# Patient Record
Sex: Male | Born: 1990
Health system: Southern US, Community
[De-identification: ages and names within clinical notes are randomized; demographics above are authoritative.]

## PROBLEM LIST (undated history)

## (undated) DIAGNOSIS — S83511A Sprain of anterior cruciate ligament of right knee, initial encounter: Secondary | ICD-10-CM

## (undated) DIAGNOSIS — S83206A Unspecified tear of unspecified meniscus, current injury, right knee, initial encounter: Secondary | ICD-10-CM

## (undated) HISTORY — PX: SKIN CANCER EXCISION: SHX779

## (undated) HISTORY — PX: NO PAST SURGERIES: SHX2092

---
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
## 2015-06-30 ENCOUNTER — Other Ambulatory Visit: Payer: Self-pay | Admitting: Urology

## 2017-05-02 ENCOUNTER — Encounter (HOSPITAL_BASED_OUTPATIENT_CLINIC_OR_DEPARTMENT_OTHER): Payer: Self-pay | Admitting: *Deleted

## 2017-05-02 NOTE — Progress Notes (Signed)
NPO AFTER MN W/ EXCEPTION CLEAR LIQUIDS UNTIL 0630 (NO CREAM [place] [employer]).  PT VERBALIZED UNDERSTANDING THIS INCLUDED NO DIP TOBACCO AFTER 0630 DOS.  ARRIVE AT 1100.  NEEDS HG.

## 2017-05-05 ENCOUNTER — Encounter (HOSPITAL_BASED_OUTPATIENT_CLINIC_OR_DEPARTMENT_OTHER)
Admission: RE | Disposition: A | Discharge status: Home / Self Care | Payer: Self-pay | Source: Ambulatory Visit | Attending: Orthopedic Surgery

## 2017-05-05 ENCOUNTER — Ambulatory Visit (HOSPITAL_BASED_OUTPATIENT_CLINIC_OR_DEPARTMENT_OTHER): Payer: Self-pay | Admitting: Anesthesiology

## 2017-05-05 ENCOUNTER — Encounter (HOSPITAL_BASED_OUTPATIENT_CLINIC_OR_DEPARTMENT_OTHER): Payer: Self-pay

## 2017-05-05 ENCOUNTER — Ambulatory Visit (HOSPITAL_BASED_OUTPATIENT_CLINIC_OR_DEPARTMENT_OTHER)
Admission: RE | Admit: 2017-05-05 | Discharge: 2017-05-05 | Disposition: A | Discharge status: Home / Self Care | Payer: Self-pay | Source: Ambulatory Visit | Attending: Orthopedic Surgery | Admitting: Orthopedic Surgery

## 2017-05-05 DIAGNOSIS — S83511A Sprain of anterior cruciate ligament of right knee, initial encounter: Secondary | ICD-10-CM | POA: Insufficient documentation

## 2017-05-05 DIAGNOSIS — M659 Synovitis and tenosynovitis, unspecified: Secondary | ICD-10-CM | POA: Insufficient documentation

## 2017-05-05 DIAGNOSIS — S83281A Other tear of lateral meniscus, current injury, right knee, initial encounter: Secondary | ICD-10-CM | POA: Insufficient documentation

## 2017-05-05 HISTORY — PX: KNEE ARTHROSCOPY WITH ANTERIOR CRUCIATE LIGAMENT (ACL) REPAIR: SHX5644

## 2017-05-05 HISTORY — DX: Sprain of anterior cruciate ligament of right knee, initial encounter: S83.511A

## 2017-05-05 HISTORY — DX: Unspecified tear of unspecified meniscus, current injury, right knee, initial encounter: S83.206A

## 2017-05-05 LAB — POCT HEMOGLOBIN-HEMACUE: HEMOGLOBIN: 15.7 g/dL (ref 13.0–17.0)

## 2017-05-05 SURGERY — KNEE ARTHROSCOPY WITH ANTERIOR CRUCIATE LIGAMENT (ACL) REPAIR
Anesthesia: General | Site: Knee | Laterality: Right

## 2017-05-05 MED ORDER — FENTANYL CITRATE (PF) 100 MCG/2ML IJ SOLN
INTRAMUSCULAR | Status: DC | PRN
  Administered 2017-05-05 (×4): 50 ug via INTRAVENOUS
  Administered 2017-05-05 (×2): 25 ug via INTRAVENOUS
  Administered 2017-05-05: 50 ug via INTRAVENOUS

## 2017-05-05 MED ORDER — ONDANSETRON HCL 4 MG/2ML IJ SOLN
INTRAMUSCULAR | Status: DC | PRN
  Administered 2017-05-05: 4 mg via INTRAVENOUS

## 2017-05-05 MED ORDER — HYDROMORPHONE HCL 1 MG/ML IJ SOLN
0.2500 mg | INTRAMUSCULAR | Status: DC | PRN
  Administered 2017-05-05: 0.25 mg via INTRAVENOUS
  Administered 2017-05-05: 0.5 mg via INTRAVENOUS
  Administered 2017-05-05: 0.25 mg via INTRAVENOUS
  Filled 2017-05-05: qty 0.5

## 2017-05-05 MED ORDER — LIDOCAINE 2% (20 MG/ML) 5 ML SYRINGE
INTRAMUSCULAR | Status: AC
  Filled 2017-05-05: qty 5

## 2017-05-05 MED ORDER — ACETAMINOPHEN 500 MG PO TABS
ORAL_TABLET | ORAL | Status: AC
  Filled 2017-05-05: qty 2

## 2017-05-05 MED ORDER — MIDAZOLAM HCL 2 MG/2ML IJ SOLN
INTRAMUSCULAR | Status: AC
  Filled 2017-05-05: qty 2

## 2017-05-05 MED ORDER — GABAPENTIN 300 MG PO CAPS
ORAL_CAPSULE | ORAL | Status: AC
  Filled 2017-05-05: qty 1

## 2017-05-05 MED ORDER — ASPIRIN EC 325 MG PO TBEC: 325.0000 mg | DELAYED_RELEASE_TABLET | Freq: Every day | ORAL | 3 refills | Status: AC

## 2017-05-05 MED ORDER — PROPOFOL 10 MG/ML IV BOLUS
INTRAVENOUS | Status: AC
  Filled 2017-05-05: qty 20

## 2017-05-05 MED ORDER — ROPIVACAINE HCL 7.5 MG/ML IJ SOLN
INTRAMUSCULAR | Status: DC | PRN
  Administered 2017-05-05: 20 mL via PERINEURAL

## 2017-05-05 MED ORDER — ACETAMINOPHEN 500 MG PO TABS
1000.0000 mg | ORAL_TABLET | Freq: Once | ORAL | Status: AC
  Administered 2017-05-05: 1000 mg via ORAL
  Filled 2017-05-05: qty 2

## 2017-05-05 MED ORDER — OXYCODONE HCL 5 MG PO TABS
5.0000 mg | ORAL_TABLET | Freq: Once | ORAL | Status: AC
  Administered 2017-05-05: 5 mg via ORAL
  Filled 2017-05-05: qty 1

## 2017-05-05 MED ORDER — GABAPENTIN 300 MG PO CAPS
300.0000 mg | ORAL_CAPSULE | Freq: Once | ORAL | Status: AC
  Administered 2017-05-05: 300 mg via ORAL
  Filled 2017-05-05: qty 1

## 2017-05-05 MED ORDER — LACTATED RINGERS IV SOLN
INTRAVENOUS | Status: DC
  Administered 2017-05-05: 14:00:00 via INTRAVENOUS
  Administered 2017-05-05: 1000 mL via INTRAVENOUS
  Filled 2017-05-05: qty 1000

## 2017-05-05 MED ORDER — PROPOFOL 10 MG/ML IV BOLUS
INTRAVENOUS | Status: DC | PRN
  Administered 2017-05-05: 200 mg via INTRAVENOUS

## 2017-05-05 MED ORDER — DEXAMETHASONE SODIUM PHOSPHATE 10 MG/ML IJ SOLN
INTRAMUSCULAR | Status: DC | PRN
  Administered 2017-05-05: 10 mg via INTRAVENOUS

## 2017-05-05 MED ORDER — OXYCODONE HCL 5 MG PO TABS: 5.0000 mg | ORAL_TABLET | Freq: Four times a day (QID) | ORAL | 0 refills | Status: AC | PRN

## 2017-05-05 MED ORDER — CEFAZOLIN SODIUM-DEXTROSE 2-4 GM/100ML-% IV SOLN
2.0000 g | Freq: Once | INTRAVENOUS | Status: AC
Start: 2017-05-05 — End: 2017-05-05
  Administered 2017-05-05: 2 g via INTRAVENOUS
  Filled 2017-05-05: qty 100

## 2017-05-05 MED ORDER — CELECOXIB 200 MG PO CAPS
ORAL_CAPSULE | ORAL | Status: AC
  Filled 2017-05-05: qty 2

## 2017-05-05 MED ORDER — FENTANYL CITRATE (PF) 100 MCG/2ML IJ SOLN
INTRAMUSCULAR | Status: AC
  Filled 2017-05-05: qty 2

## 2017-05-05 MED ORDER — HYDROMORPHONE HCL-NACL 0.5-0.9 MG/ML-% IV SOSY
PREFILLED_SYRINGE | INTRAVENOUS | Status: AC
  Filled 2017-05-05: qty 1

## 2017-05-05 MED ORDER — WHITE PETROLATUM GEL
Status: AC
  Filled 2017-05-05: qty 5

## 2017-05-05 MED ORDER — CHLORHEXIDINE GLUCONATE 4 % EX LIQD
60.0000 mL | Freq: Once | CUTANEOUS | Status: DC
  Filled 2017-05-05: qty 118

## 2017-05-05 MED ORDER — CELECOXIB 400 MG PO CAPS
400.0000 mg | ORAL_CAPSULE | Freq: Once | ORAL | Status: AC
  Administered 2017-05-05: 400 mg via ORAL
  Filled 2017-05-05: qty 1

## 2017-05-05 MED ORDER — CEFAZOLIN SODIUM-DEXTROSE 2-4 GM/100ML-% IV SOLN
INTRAVENOUS | Status: AC
  Filled 2017-05-05: qty 100

## 2017-05-05 MED ORDER — PROMETHAZINE HCL 25 MG/ML IJ SOLN
6.2500 mg | INTRAMUSCULAR | Status: DC | PRN
  Filled 2017-05-05: qty 1

## 2017-05-05 MED ORDER — BUPIVACAINE HCL (PF) 0.25 % IJ SOLN
INTRAMUSCULAR | Status: DC | PRN
  Administered 2017-05-05: 20 mL

## 2017-05-05 MED ORDER — OXYCODONE HCL 5 MG PO TABS
ORAL_TABLET | ORAL | Status: AC
  Filled 2017-05-05: qty 1

## 2017-05-05 MED ORDER — MIDAZOLAM HCL 2 MG/2ML IJ SOLN
INTRAMUSCULAR | Status: DC | PRN
  Administered 2017-05-05: 2 mg via INTRAVENOUS

## 2017-05-05 MED ORDER — GLYCOPYRROLATE 0.2 MG/ML IV SOSY
PREFILLED_SYRINGE | INTRAVENOUS | Status: DC | PRN
  Administered 2017-05-05: .2 mg via INTRAVENOUS

## 2017-05-05 MED ORDER — CEFAZOLIN SODIUM-DEXTROSE 2-4 GM/100ML-% IV SOLN
2.0000 g | INTRAVENOUS | Status: AC
  Administered 2017-05-05: 2 g via INTRAVENOUS
  Filled 2017-05-05: qty 100

## 2017-05-05 MED ORDER — LIDOCAINE 2% (20 MG/ML) 5 ML SYRINGE
INTRAMUSCULAR | Status: DC | PRN
  Administered 2017-05-05: 80 mg via INTRAVENOUS

## 2017-05-05 MED ORDER — GLYCOPYRROLATE 0.2 MG/ML IV SOSY
PREFILLED_SYRINGE | INTRAVENOUS | Status: AC
  Filled 2017-05-05: qty 5

## 2017-05-05 MED FILL — oxyCODONE HCL 5 MG TABS: 5 | 4 days supply | Qty: 30 | Fill #0

## 2017-05-05 SURGICAL SUPPLY — 91 items
ANCH SUT SWLK 19.1X4.75 (Anchor) ×1 IMPLANT
ANCHOR BUTTON TIGHTROPE ABS (Orthopedic Implant) ×2 IMPLANT
ANCHOR SUT BIO SW 4.75X19.1 (Anchor) ×2 IMPLANT
BANDAGE ELASTIC 6 VELCRO ST LF (GAUZE/BANDAGES/DRESSINGS) ×3 IMPLANT
BANDAGE ESMARK 6X9 LF (GAUZE/BANDAGES/DRESSINGS) ×1 IMPLANT
BLADE 4.2CUDA (BLADE) IMPLANT
BLADE CUDA 5.5 (BLADE) IMPLANT
BLADE CUDA GRT WHITE 3.5 (BLADE) IMPLANT
BLADE GREAT WHITE 4.2 (BLADE) IMPLANT
BLADE GREAT WHITE 4.2MM (BLADE)
BLADE GREAT WHITE SHAVER 5.5 (BLADE) IMPLANT
BLADE GREAT WHITE SHAVER 5.5MM (BLADE)
BLADE SURG 10 STRL SS (BLADE) ×3 IMPLANT
BLADE SURG 11 STRL SS (BLADE) ×4 IMPLANT
BLADE SURG 15 STRL LF DISP TIS (BLADE) ×1 IMPLANT
BLADE SURG 15 STRL SS (BLADE) ×6
BNDG CMPR 9X6 STRL LF SNTH (GAUZE/BANDAGES/DRESSINGS) ×1
BNDG ESMARK 6X9 LF (GAUZE/BANDAGES/DRESSINGS) ×3
BNDG GAUZE ELAST 4 BULKY (GAUZE/BANDAGES/DRESSINGS) ×3 IMPLANT
BUR OVAL 6.0 (BURR) IMPLANT
BUR VERTEX HOODED 4.5 (BURR) IMPLANT
CANISTER SUCTION 1200CC (MISCELLANEOUS) ×3 IMPLANT
CLOSURE WOUND 1/2 X4 (GAUZE/BANDAGES/DRESSINGS) ×2
COVER BACK TABLE 60X90IN (DRAPES) ×3 IMPLANT
CUFF TOURNIQUET SINGLE 34IN LL (TOURNIQUET CUFF) ×3 IMPLANT
CUTTER FLIP 10MM (CUTTER) ×2 IMPLANT
CUTTER FLIP 9.5MM (CUTTER) ×2 IMPLANT
DRAPE ARTHROSCOPY W/POUCH 114 (DRAPES) ×3 IMPLANT
DRAPE C-ARM 42X72 X-RAY (DRAPES) ×1 IMPLANT
DRAPE INCISE IOBAN 66X45 STRL (DRAPES) ×2 IMPLANT
DRAPE LG THREE QUARTER DISP (DRAPES) ×3 IMPLANT
DRAPE U-SHAPE 47X51 STRL (DRAPES) ×3 IMPLANT
DURAPREP 26ML APPLICATOR (WOUND CARE) ×3 IMPLANT
ELECT REM PT RETURN 9FT ADLT (ELECTROSURGICAL) ×3
ELECTRODE REM PT RTRN 9FT ADLT (ELECTROSURGICAL) ×1 IMPLANT
FIBERSTICK 2 (SUTURE) ×1 IMPLANT
GAUZE SPONGE 4X4 12PLY STRL (GAUZE/BANDAGES/DRESSINGS) ×2 IMPLANT
GAUZE SPONGE 4X4 12PLY STRL LF (GAUZE/BANDAGES/DRESSINGS) ×2 IMPLANT
GAUZE XEROFORM 1X8 LF (GAUZE/BANDAGES/DRESSINGS) ×3 IMPLANT
GLOVE BIO SURGEON STRL SZ7.5 (GLOVE) ×11 IMPLANT
GLOVE INDICATOR 8.0 STRL GRN (GLOVE) ×5 IMPLANT
GOWN STRL REUS W/ TWL LRG LVL3 (GOWN DISPOSABLE) ×1 IMPLANT
GOWN STRL REUS W/ TWL XL LVL3 (GOWN DISPOSABLE) ×2 IMPLANT
GOWN STRL REUS W/TWL LRG LVL3 (GOWN DISPOSABLE) ×3
GOWN STRL REUS W/TWL XL LVL3 (GOWN DISPOSABLE) ×6
IMMOBILIZER KNEE 22 UNIV (SOFTGOODS) IMPLANT
IV NS IRRIG 3000ML ARTHROMATIC (IV SOLUTION) ×8 IMPLANT
KIT RM TURNOVER CYSTO AR (KITS) ×3 IMPLANT
KNEE WRAP E Z 3 GEL PACK (MISCELLANEOUS) ×3 IMPLANT
LABRAL TAPE ×2 IMPLANT
MANIFOLD NEPTUNE II (INSTRUMENTS) ×3 IMPLANT
NEEDLE ELECTRODE (NEEDLE) IMPLANT
NEEDLE HYPO 22GX1.5 SAFETY (NEEDLE) ×3 IMPLANT
PACK ARTHROSCOPY DSU (CUSTOM PROCEDURE TRAY) ×3 IMPLANT
PACK BASIN DAY SURGERY FS (CUSTOM PROCEDURE TRAY) ×3 IMPLANT
PAD ABD 8X10 STRL (GAUZE/BANDAGES/DRESSINGS) ×4 IMPLANT
PAD ARMBOARD 7.5X6 YLW CONV (MISCELLANEOUS) IMPLANT
PENCIL BUTTON HOLSTER BLD 10FT (ELECTRODE) ×2 IMPLANT
PK GRAFTLINK AUTO IMPLANT SYST (Anchor) ×3 IMPLANT
PROBE BIPOLAR ATHRO 135MM 90D (MISCELLANEOUS) IMPLANT
SET ARTHROSCOPY TUBING (MISCELLANEOUS) ×3
SET ARTHROSCOPY TUBING LN (MISCELLANEOUS) ×1 IMPLANT
SET PAD KNEE POSITIONER (MISCELLANEOUS) IMPLANT
SHAVER 4.2 MM LANZA 9391A (BLADE) ×2 IMPLANT
SPONGE GAUZE 4X4 12PLY STER LF (GAUZE/BANDAGES/DRESSINGS) ×6 IMPLANT
SPONGE LAP 4X18 X RAY DECT (DISPOSABLE) ×3 IMPLANT
STRIP CLOSURE SKIN 1/2X4 (GAUZE/BANDAGES/DRESSINGS) ×3 IMPLANT
SUCTION FRAZIER HANDLE 10FR (MISCELLANEOUS) ×2
SUCTION TUBE FRAZIER 10FR DISP (MISCELLANEOUS) ×1 IMPLANT
SUT 2 FIBERLOOP 20 STRT BLUE (SUTURE)
SUT FIBERWIRE #2 38 REV NDL BL (SUTURE)
SUT FIBERWIRE #2 38 T-5 BLUE (SUTURE)
SUT MNCRL AB 3-0 PS2 18 (SUTURE) ×5 IMPLANT
SUT VIC AB 0 CT2 27 (SUTURE) ×6 IMPLANT
SUT VIC AB 2-0 CT2 27 (SUTURE) ×3 IMPLANT
SUT VIC AB 2-0 SH 27 (SUTURE) ×6
SUT VIC AB 2-0 SH 27XBRD (SUTURE) ×2 IMPLANT
SUTURE 2 FIBERLOOP 20 STRT BLU (SUTURE) ×1 IMPLANT
SUTURE FIBERWR #2 38 T-5 BLUE (SUTURE) IMPLANT
SUTURE FIBERWR#2 38 REV NDL BL (SUTURE) IMPLANT
SUTURE TIGERSTICK 2 TIGERWIR 2 (MISCELLANEOUS) ×1 IMPLANT
SYR CONTROL 10ML LL (SYRINGE) ×3 IMPLANT
SYSTEM GRAFT IMPLANT AUTOGRAFT (Anchor) IMPLANT
SYSTEM IMPL ACL/PCL SWIVILLOCK (Anchor) ×2 IMPLANT
TAPE STRIPS DRAPE STRL (GAUZE/BANDAGES/DRESSINGS) ×2 IMPLANT
TIGERSTICK 2 TIGERWIRE 2 (MISCELLANEOUS)
TOWEL OR 17X24 6PK STRL BLUE (TOWEL DISPOSABLE) ×6 IMPLANT
TUBE CONNECTING 12'X1/4 (SUCTIONS) ×2
TUBE CONNECTING 12X1/4 (SUCTIONS) ×4 IMPLANT
WAND 30 DEG SABER W/CORD (SURGICAL WAND) IMPLANT
WATER STERILE IRR 500ML POUR (IV SOLUTION) ×3 IMPLANT

## 2017-05-05 NOTE — Progress Notes (Signed)
Dr.Greene was called to check patients left fingers for numbness.  She talked with  the patient and his family and informed them that she had checked his chart and was able to see that there was no difficulties with hand and finger movement. The color of his hand and fingers appear to be normal..  The patient and his family were instructed to call surgeon if the symptoms were still there tomorrow.  There was good capillary filling.Joshua Hancock
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
## 2017-05-05 NOTE — Progress Notes (Signed)
Spoke with [employer].   They are to bring bledsoe brace.

## 2017-05-05 NOTE — Discharge Instructions (Signed)
Orthopedic discharge instructions:  Maintain brace on right knee at all times unless showering and getting dressed. This should be locked in full extension on 0.  Partial weightbearing as able with crutches until the quadriceps muscle is back to full strength on the right leg. This will take approximately 1-2 weeks.  Maintain postoperative dressing for 3 days. He may remove at that time and begin showering. Do not remove the Steri-Strips. Keep wounds covered with a clean dry dressing.  To prevent blood clots take a full strength, 325 mg, aspirin once daily for 6 weeks.  Perform straight leg raises as soon as able on the right leg. This is done by keeping the knee completely straight and raising the heel approximately 12 inches off of the bed. This will be done while lying flat. Repeat this for 10-15 reps 3-4 times per day.  Return to clinic to see Dr. Stann Mainland in 2 weeks.  ARTHROSCOPIC KNEE SURGERY HOME CARE INSTRUCTIONS   PAIN You will be expected to have a moderate amount of pain in the affected knee for approximately two weeks.  However, the first two to four days will be the most severe in terms of the pain you will experience.  Prescriptions have been provided for you to take as needed for the pain.  The pain can be markedly reduced by using the ice/compressive bandage given.  Exchange the ice packs whenever they thaw.  During the night, keep the bandage on because it will still provide some compression for the swelling.  Also, keep the leg elevated on pillows above your heart, and this will help alleviate the pain and swelling.  MEDICATION Prescriptions have been provided to take as needed for pain. To prevent blood clots, take Aspirin 325mg  daily with a meal if not on a blood thinner and if no history of stomach ulcers.  ACTIVITY It is preferred that you stay on bedrest for approximately 24 hours.  However, you may go to the bathroom with help.  After this, you can start to be up and  about progressively more.  Remember that the swelling may still increase after three to four days if you are up and doing too much.  You may put as much weight on the affected leg as pain will allow.  Use your crutches for comfort and safety.  However, as soon as you are able, you may discard the crutches and go without them.   DRESSING Keep the current dressing as dry as possible.  Two days after your surgery, you may remove the ice/compressive wrap, and surgical dressing.  You may now take a shower, but do not scrub the sounds directly with soap.  Let water rinse over these and gently wipe with your hand.  Reapply band-aids over the puncture wounds and more gauze if needed.  A slight amount of thin drainage can be normal at this time, and do not let it frighten you.  Reapply the ice/compressive wrap.  You may now repeat this every day each time you shower.  SYMPTOMS TO REPORT TO YOUR DOCTOR  -Extreme pain.  -Extreme swelling.  -Temperature above 101 degrees that does not come down with acetaminophen     (Tylenol).  -Any changes in the feeling, color or movement of your toes.  -Extreme redness, heat, swelling or drainage at your incision  EXERCISE It is preferred that you begin to exercise on the day of your surgery.  Straight leg raises and short arc quads should be begun the afternoon  or evening of surgery and continued until you come back for your follow-up appointment.   Attached is an instruction sheet on how to perform these two simple exercises.  Do these at least three times per day if not more.  You may bend your knee as much as is comfortable.  The puncture wounds may occasionally be slightly uncomfortable with bending of the knee.  Do not let this frighten you.  It is important to keep your knee motion, but do not overdo it.  If you have significant pain, simply do not bend the knee as far.   You will be given more exercises to perform at your first return visit.     Patient Signature:   ________________________________________________________  Nurse's Signature:  ________________________________________________________  Post Anesthesia Home Care Instructions  Activity: Get plenty of rest for the remainder of the day. A responsible individual must stay with you for 24 hours following the procedure.  For the next 24 hours, DO NOT: -Drive a car -Paediatric nurse -Drink alcoholic beverages -Take any medication unless instructed by your physician -Make any legal decisions or sign important papers.  Meals: Start with liquid foods such as gelatin or soup. Progress to regular foods as tolerated. Avoid greasy, spicy, heavy foods. If nausea and/or vomiting occur, drink only clear liquids until the nausea and/or vomiting subsides. Call your physician if vomiting continues.  Special Instructions/Symptoms: Your throat may feel dry or sore from the anesthesia or the breathing tube placed in your throat during surgery. If this causes discomfort, gargle with warm salt water. The discomfort should disappear within 24 hours.  If you had a scopolamine patch placed behind your ear for the management of post- operative nausea and/or vomiting:  1. The medication in the patch is effective for 72 hours, after which it should be removed.  Wrap patch in a tissue and discard in the trash. Wash hands thoroughly with soap and water. 2. You may remove the patch earlier than 72 hours if you experience unpleasant side effects which may include dry mouth, dizziness or visual disturbances. 3. Avoid touching the patch. Wash your hands with soap and water after contact with the patch.   Regional Anesthesia Blocks  1. Numbness or the inability to move the "blocked" extremity may last from 3-48 hours after placement. The length of time depends on the medication injected and your individual response to the medication. If the numbness is not going away after 48 hours, call your surgeon.  2. The extremity  that is blocked will need to be protected until the numbness is gone and the  Strength has returned. Because you cannot feel it, you will need to take extra care to avoid injury. Because it may be weak, you may have difficulty moving it or using it. You may not know what position it is in without looking at it while the block is in effect.  3. For blocks in the legs and feet, returning to weight bearing and walking needs to be done carefully. You will need to wait until the numbness is entirely gone and the strength has returned. You should be able to move your leg and foot normally before you try and bear weight or walk. You will need someone to be with you when you first try to ensure you do not fall and possibly risk injury.  4. Bruising and tenderness at the needle site are common side effects and will resolve in a few days.  5. Persistent numbness or new  problems with movement should be communicated to the surgeon or the [facility] [phone] [place] [phone]).

## 2017-05-05 NOTE — Anesthesia Procedure Notes (Signed)
Procedure Name: LMA Insertion Date/Time: 05/05/2017 12:47 PM Performed by: Denna Haggard D Pre-anesthesia Checklist: Patient identified, Emergency Drugs available, Suction available and Patient being monitored Patient Re-evaluated:Patient Re-evaluated prior to induction Oxygen Delivery Method: Circle system utilized Preoxygenation: Pre-oxygenation with 100% oxygen Induction Type: IV induction Ventilation: Mask ventilation without difficulty LMA: LMA inserted LMA Size: 4.0 Number of attempts: 1 Airway Equipment and Method: Bite block Placement Confirmation: positive ETCO2 Tube secured with: Tape Dental Injury: Teeth and Oropharynx as per pre-operative assessment

## 2017-05-05 NOTE — Anesthesia Procedure Notes (Signed)
Anesthesia Regional Block: Adductor canal block   Pre-Anesthetic Checklist: ,, timeout performed, Correct Patient, Correct Site, Correct Laterality, Correct Procedure, Correct Position, site marked, Risks and benefits discussed,  Surgical consent,  Pre-op evaluation,  At surgeon's request and post-op pain management  Laterality: Right  Prep: chloraprep       Needles:  Injection technique: Single-shot  Needle Type: Echogenic Needle     Needle Length: 9cm  Needle Gauge: 21     Additional Needles:   Narrative:  Start time: 05/05/2017 12:20 PM End time: 05/05/2017 12:25 PM Injection made incrementally with aspirations every 5 mL.  Performed by: Personally  Anesthesiologist: Catalina Gravel  Additional Notes: No pain on injection. No increased resistance to injection. Injection made in 5cc increments.  Good needle visualization.  Patient tolerated procedure well.

## 2017-05-05 NOTE — Brief Op Note (Signed)
05/05/2017  4:02 PM  PATIENT:  Joshua Hancock  26 y.o. male  PRE-OPERATIVE DIAGNOSIS:  Right knee ACL tear, lateral meniscus tear  POST-OPERATIVE DIAGNOSIS:  Right knee ACL tear, lateral meniscus tear  PROCEDURE:  Procedure(s): RIGHT KNEE ARTHROSCOPIC ACL AUTOGRAFT RECONSTRUCTION WITH HAMSTRING AND LATERAL MENISCUS partial meniscectomy  SURGEON:  Surgeon(s) and Role:    * Nicholes Stairs, MD - Primary  PHYSICIAN ASSISTANT:   ASSISTANTS: none   ANESTHESIA:   regional and general  EBL:  Total I/O In: 1400 [I.V.:1400] Out: 15 [Blood:15]  BLOOD ADMINISTERED:none  DRAINS: none   LOCAL MEDICATIONS USED:  MARCAINE     SPECIMEN:  No Specimen  DISPOSITION OF SPECIMEN:  N/A  COUNTS:  YES  TOURNIQUET:   Total Tourniquet Time Documented: Thigh (Right) - 20 minutes Thigh (Right) - 98 minutes Total: Thigh (Right) - 118 minutes   DICTATION: .Viviann Spare Dictation  PLAN OF CARE: Discharge to home after PACU  PATIENT DISPOSITION:  PACU - hemodynamically stable.   Delay start of Pharmacological VTE agent (>24hrs) due to surgical blood loss or risk of bleeding: not applicable

## 2017-05-05 NOTE — Transfer of Care (Signed)
Immediate Anesthesia Transfer of Care Note  Patient: MASAICHI KRACHT  Procedure(s) Performed: Procedure(s) (LRB): RIGHT KNEE ARTHROSCOPIC ACL AUTOGRAFT RECONSTRUCTION WITH HAMSTRING AND LATERAL MENISCUS REPAIR (Right)  Patient Location: PACU  Anesthesia Type: General  Level of Consciousness: awake, oriented, sedated and patient cooperative  Airway & Oxygen Therapy: Patient Spontanous Breathing and Patient connected to face mask oxygen  Post-op Assessment: Report given to PACU RN and Post -op Vital signs reviewed and stable  Post vital signs: Reviewed and stable  Complications: No apparent anesthesia complications  Last Vitals:  Vitals:   05/05/17 1033 05/05/17 1554  BP: 119/67 (P) 118/80  Pulse: 76   Resp: 18 (P) 13  Temp: 37 C (P) 36.5 C  SpO2: 100%     Last Pain:  Vitals:   05/05/17 1033  TempSrc: Oral      Patients Stated Pain Goal: 8 (05/05/17 1200)

## 2017-05-05 NOTE — Anesthesia Preprocedure Evaluation (Addendum)
Anesthesia Evaluation  Patient identified by MRN, date of birth, ID band Patient awake    Reviewed: Allergy & Precautions, NPO status , Patient's Chart, lab work & pertinent test results  Airway Mallampati: II  TM Distance: <3 FB Neck ROM: Full    Dental  (+) Teeth Intact, Dental Advisory Given   Pulmonary neg pulmonary ROS,    Pulmonary exam normal breath sounds clear to auscultation       Cardiovascular negative cardio ROS Normal cardiovascular exam Rhythm:Regular Rate:Normal     Neuro/Psych negative neurological ROS     GI/Hepatic negative GI ROS, Neg liver ROS,   Endo/Other  negative endocrine ROS  Renal/GU negative Renal ROS     Musculoskeletal negative musculoskeletal ROS (+) Right ACL tear   Abdominal   Peds  Hematology negative hematology ROS (+)   Anesthesia Other Findings Day of surgery medications reviewed with the patient.  Reproductive/Obstetrics                            Anesthesia Physical Anesthesia Plan  ASA: I  Anesthesia Plan: General   Post-op Pain Management:  Regional for Post-op pain   Induction: Intravenous  PONV Risk Score and Plan: 2 and Ondansetron and Dexamethasone  Airway Management Planned: LMA  Additional Equipment:   Intra-op Plan:   Post-operative Plan: Extubation in OR  Informed Consent: I have reviewed the patients History and Physical, chart, labs and discussed the procedure including the risks, benefits and alternatives for the proposed anesthesia with the patient or authorized representative who has indicated his/her understanding and acceptance.   Dental advisory given  Plan Discussed with: CRNA  Anesthesia Plan Comments: (Risks/benefits of general anesthesia discussed with patient including risk of damage to teeth, lips, gum, and tongue, nausea/vomiting, allergic reactions to medications, and the possibility of heart attack, stroke  and death.  All patient questions answered.  Patient wishes to proceed.)        Anesthesia Quick Evaluation

## 2017-05-07 NOTE — Op Note (Signed)
Surgery Date: 05/05/2017  Surgeon(s): Nicholes Stairs, MD  ASSIST: none  Implants:  Arthrex all inside cortical buttons on femur and tibia 4.75 PEEK swivel lock x 1.  ANESTHESIA:  general, and adductor block  IV FLUIDS AND URINE: See anesthesia.  TOURNIQUET: 115 minutes  DRAINS: none  COMPLICATIONS: None.   ESTIMATED BLOOD LOSS: minimal  PREOPERATIVE DIAGNOSES:  1. Right knee Lateral meniscus tear 2. Right knee complete ACL rupture  POSTOPERATIVE DIAGNOSES:  same  PROCEDURES PERFORMED:  1. Right knee arthroscopy with Hamstring autograft ACL reconstruction 2.  Partial lateral meniscectomy right knee  DESCRIPTION OF PROCEDURE: Mr. Tanney is a 26 y.o.-year-old male with Right knee Lateral meniscus tear, Complete ACL rupture, partial MCL tear.  He sustained these injuries about 2 months prior to coming to the operating room today.  We discussed nonoperative management of the MCL.  After allowing do diligence and healing of that we discussed proceeding with arthroscopically assisted hamstring autograft ACL reconstruction and partial lateral meniscectomy versus repair.  We reviewed the risks benefits and indications of this procedure including but not limited to bleeding, infection, damage to neurovascular structures, need for future surgery, developed an of arthrosis, rupture of graft, continued instability of the knee, and Velban of blood clots and risk of anesthesia.  All questions answered.  The patient was identified in the preoperative holding area and the operative extremity was marked. The patient was brought to the operating room and transferred to operating table in a supine position. Satisfactory general anesthesia was induced by anesthesiology.    Examination under anesthesia revealed a grade 2B Lachman, grade 2 pivot shift, and stable to varus and valgus stress.   The procedure was initiated by obtaining a hamstring graft. An Esmarch was used to wrap out  the leg and the tourniquet was raised for a short time. A 3-inch incision was created over the pes anserine. Dissection was carried out to the level of the sartorius, which was divided above the gracilis tendon, then everted to reveal the semitendinosus tendon which was released distally, tagged and stripped per usual. The sartorius and gracilis were then repaired back to their insertion with heavy Vicryl suture, that was later incorporated into the swivel lock anchor.  At the back table, I next prepared the graft by removing muscle and tenosynovium. The semitendinosis graft were was cut to length and quadrupled, looping around the Arthrex ACL TightRope for the femoral side and ABS tightrope for the tibial side. The two free ends of the autograft were secured to each other at the tibial side with #2 interlocking FiberWire sutures. The remainder of the graft was then circumferentially secured according to the manufacturer's recommendations with a 0 FiberWire suture twice at the tibial side and once at the femoral side. The graft was pretensioned on the back table and wrapped in a saline-soaked gauze.  The graft measured 70 mm in total length, 9.5 mm on femoral tunnel diameter, and 10 mm diameter on the tibial tunnel.  Standard anterolateral, anteromedial arthroscopy portals were obtained. The anteromedial portal was obtained with a spinal needle for localization under direct visualization with subsequent diagnostic findings.   Anteromedial and anterolateral chambers: mild synovitis. The synovitis was debrided with a 4.5 mm full radius shaver through both the anteromedial and lateral portals.   Suprapatellar pouch and gutters: no synovitis or debris. Patella chondral surface: Grade 0 Trochlear chondral surface: Grade 0 Patellofemoral tracking: Midline, no tilt Medial meniscus: Intact, no tear.  Medial femoral condyle flexion bearing  surface: Grade 0 Medial femoral condyle extension bearing surface:  Grade 0 Medial tibial plateau: Grade 0 Anterior cruciate ligament:Complete mid substance tear Posterior cruciate ligament:stable Lateral meniscus: White zone posterior horn radial tear..   Lateral femoral condyle flexion bearing surface: Grade 0 Lateral femoral condyle extension bearing surface: Grade 0 Lateral tibial plateau: Grade 1  The lateral meniscus was inspected closely and found to have a white zone radial tear about 2 mm lateral to the posterior root attachment.  This did not complete through the entire meniscus and did not propagate to the capsule attachment.  There was a unstable piece of meniscal tissue that was debrided back with the motorized shaver until it was stable.  This completed the lateral partial meniscectomy.  We did also perform a chondroplasty of the lateral tibial plateau underneath this tear.  This was performed utilizing the motorized shaver as well.  Next, the ACL reconstruction was undertaken. The ACL stump was removed with thermal ablation and shaver and anatomic bony landmarks were marked for the placement of the femoral and tibial sockets. Arthrex retroguides and Flipcutters were used to create the sockets and perform the procedure by an all-inside GraftLink technique. The femoral socket was created at the inferior portion of the bifurcate ridge of the lateral femoral wall with a size 9.5 mm FlipCutter to a depth of  15 mm while the tibial socket was created at the center of the ACL footprint from front to back and toward the base of the medial tibial eminence from medial to lateral, to a depth of 23-25 mm with a 10 mm FlipCutter. Bony debris was removed and the edges of socket apertures were smoothed. Suture shuttles were used to deliver the graft into the femoral socket first and the tibial socket second. The graft was then secured within the sockets, cinching the self-locking sutures overtop of the proximal and distal cortical buttons with the knee in a reduced  position maintained at 20 degrees flexion while a moderate force posterior drawer was applied. After this preliminary tensioning, the knee was placed through several flexion-extension cycles to eliminate any graft settling or excursion and the graft was re-tensioned in the same manner and the sutures were tied over top of the buttons proximally and distally, and the four tibial sided suture arms were secondarily secured at the proximal tibia with 1 SwiveLock anchor.Of note we did also pass a free labral tape through the ACL fixation as an separate internal brace backup fixation.   Final images of the ACL graft were obtained, revealing no lateral wall or roof impingement of the graft at the notch through range of motion. Stability of the ACL graft was assessed and found to be normalized at grade 0 lachman and grade 0 pivot shift.   The wounds were all closed in layers per usual. Dressings were applied and a brace placed And locked in 0 of flexion.. There were no apparent complications. The patient was awakened and taken to recovery room in satisfactory condition.  POSTOPERATIVE PLAN: Decklin Weddington will be touch down weight bearing on crutches until cleared by The therapist.  he will likewise be in The knee brace with it locked until her quad function is normalized.  He will be on 325 mg asa daily for 1 month for DVT PPX.  He will return to the clinic to see the surgeon in 2 weeks.  Nicholes Stairs

## 2017-05-08 ENCOUNTER — Encounter (HOSPITAL_BASED_OUTPATIENT_CLINIC_OR_DEPARTMENT_OTHER): Payer: Self-pay | Admitting: Orthopedic Surgery

## 2017-05-10 NOTE — Anesthesia Postprocedure Evaluation (Signed)
Anesthesia Post Note  Patient: Joshua Hancock  Procedure(s) Performed: Procedure(s) (LRB): RIGHT KNEE ARTHROSCOPIC ACL AUTOGRAFT RECONSTRUCTION WITH HAMSTRING AND LATERAL MENISCUS REPAIR (Right)     Patient location during evaluation: PACU Anesthesia Type: General Level of consciousness: awake Pain management: pain level controlled Vital Signs Assessment: post-procedure vital signs reviewed and stable Respiratory status: spontaneous breathing Cardiovascular status: stable Anesthetic complications: no    Last Vitals:  Vitals:   05/05/17 1700 05/05/17 1819  BP: (!) 120/93 127/75  Pulse: 94 88  Resp: 18 18  Temp:  36.7 C  SpO2: 100% 99%    Last Pain:  Vitals:   05/08/17 1058  TempSrc:   PainSc: 7                  Meylin Stenzel

## 2019-03-07 ENCOUNTER — Telehealth: Payer: Self-pay | Admitting: Physician Assistant

## 2019-03-07 DIAGNOSIS — R531 Weakness: Secondary | ICD-10-CM

## 2019-03-07 DIAGNOSIS — R42 Dizziness and giddiness: Secondary | ICD-10-CM

## 2019-03-07 DIAGNOSIS — R197 Diarrhea, unspecified: Secondary | ICD-10-CM

## 2019-03-07 NOTE — Progress Notes (Signed)
Based on what you shared with me, I feel your condition warrants further evaluation and I recommend that you be seen for a face to face office visit.  I agree the milk is a likely culprit and there may be a bacterial gastroenteritis at play but giving the dizziness and weakness noted, you need to be seen in person for a detailed examination, assessment of hydration status as IV fluids may be needed, and assessment of your renal function.  NOTE: If you entered your credit card information for this eVisit, you will not be charged. You may see a "hold" on your card for the $35 but that hold will drop off and you will not have a charge processed.  If you are having a true medical emergency please call 911.     For an urgent face to face visit, [facility] has five urgent care centers for your convenience:    [email or web address] to reserve your spot online an avoid wait times  [facility] (New Address!) [address], [place], [place] [address] *Just off [address], across the road from [place] hours of operation: Monday-Friday, 12 PM to 6 PM  Closed Saturday & Sunday   The following sites will take your insurance:  . [facility]    [phone]                  Get Driving Directions  [address], [place] [address] . 10 am to 8 pm Monday-Friday . 12 pm to 8 pm Saturday-Sunday   . [facility] at [place]                  Get Driving Directions  [address], [place], [place] [address] . 8 am to 8 pm Monday-Friday . 9 am to 6 pm Saturday . 11 am to 6 pm Sunday   . [facility] at [place]                  Get Driving Directions   [address] [place], [place] [address] . 8 am to 8 pm Monday-Friday . 8 am to 4 pm Saturday-Sunday    . [facility] at [place]                     Get Driving Directions  [phone]  [address] [place], [place] [address]  . Monday-Friday, 12 PM to 6 PM    Your e-visit answers were reviewed by a board certified advanced clinical practitioner to complete your personal care plan.  Thank you for using e-Visits.

## 2019-03-09 DIAGNOSIS — R197 Diarrhea, unspecified: Secondary | ICD-10-CM | POA: Diagnosis not present

## 2019-03-09 DIAGNOSIS — K529 Noninfective gastroenteritis and colitis, unspecified: Secondary | ICD-10-CM | POA: Diagnosis not present

## 2019-03-10 DIAGNOSIS — R197 Diarrhea, unspecified: Secondary | ICD-10-CM | POA: Diagnosis not present

## 2019-03-19 DIAGNOSIS — R110 Nausea: Secondary | ICD-10-CM | POA: Diagnosis not present

## 2019-03-19 DIAGNOSIS — R5383 Other fatigue: Secondary | ICD-10-CM | POA: Diagnosis not present

## 2019-03-28 DIAGNOSIS — R197 Diarrhea, unspecified: Secondary | ICD-10-CM | POA: Diagnosis not present

## 2019-04-16 ENCOUNTER — Encounter: Payer: Self-pay | Admitting: Physician Assistant

## 2019-04-16 ENCOUNTER — Other Ambulatory Visit (INDEPENDENT_AMBULATORY_CARE_PROVIDER_SITE_OTHER): Payer: 59

## 2019-04-16 ENCOUNTER — Ambulatory Visit (INDEPENDENT_AMBULATORY_CARE_PROVIDER_SITE_OTHER): Payer: 59 | Admitting: Physician Assistant

## 2019-04-16 ENCOUNTER — Ambulatory Visit: Payer: Self-pay | Admitting: Gastroenterology

## 2019-04-16 VITALS — BP 106/74 | HR 72 | Temp 98.4°F | Ht 69.0 in | Wt 167.2 lb

## 2019-04-16 DIAGNOSIS — R5383 Other fatigue: Secondary | ICD-10-CM

## 2019-04-16 DIAGNOSIS — A09 Infectious gastroenteritis and colitis, unspecified: Secondary | ICD-10-CM

## 2019-04-16 DIAGNOSIS — R634 Abnormal weight loss: Secondary | ICD-10-CM | POA: Diagnosis not present

## 2019-04-16 DIAGNOSIS — R109 Unspecified abdominal pain: Secondary | ICD-10-CM

## 2019-04-16 LAB — CBC WITH DIFFERENTIAL/PLATELET
Basophils Absolute: 0 10*3/uL (ref 0.0–0.1)
Basophils Relative: 0.5 % (ref 0.0–3.0)
Eosinophils Absolute: 0.1 10*3/uL (ref 0.0–0.7)
Eosinophils Relative: 0.9 % (ref 0.0–5.0)
HCT: 45.5 % (ref 39.0–52.0)
Hemoglobin: 15.8 g/dL (ref 13.0–17.0)
Lymphocytes Relative: 34.6 % (ref 12.0–46.0)
Lymphs Abs: 2.3 10*3/uL (ref 0.7–4.0)
MCHC: 34.6 g/dL (ref 30.0–36.0)
MCV: 90.9 fl (ref 78.0–100.0)
Monocytes Absolute: 0.5 10*3/uL (ref 0.1–1.0)
Monocytes Relative: 7.6 % (ref 3.0–12.0)
Neutro Abs: 3.7 10*3/uL (ref 1.4–7.7)
Neutrophils Relative %: 56.4 % (ref 43.0–77.0)
Platelets: 179 10*3/uL (ref 150.0–400.0)
RBC: 5.01 Mil/uL (ref 4.22–5.81)
RDW: 12.5 % (ref 11.5–15.5)
WBC: 6.5 10*3/uL (ref 4.0–10.5)

## 2019-04-16 LAB — SEDIMENTATION RATE: Sed Rate: 4 mm/hr (ref 0–15)

## 2019-04-16 LAB — HIGH SENSITIVITY CRP: CRP, High Sensitivity: 0.29 mg/L (ref 0.000–5.000)

## 2019-04-16 MED ORDER — HYOSCYAMINE SULFATE 0.125 MG SL SUBL: 0.1250 mg | SUBLINGUAL_TABLET | SUBLINGUAL | 1 refills | Status: DC | PRN

## 2019-04-16 NOTE — Progress Notes (Signed)
Subjective:    Patient ID: Joshua Hancock, male    DOB: November 18, 1990, 28 y.o.   MRN: [id number]  HPI Joshua Hancock is a pleasant 28 year old white male, new to GI today referred by Sheppard Penton, MD for evaluation of acute diarrheal illness with nausea and 26 pound weight loss. Patient is in generally good health, does have history of ADD. He says his current symptoms started acutely after he drank a whole gallon of old milk.  He said the milk had expired but smelled and tasted fine and he did not want to waste it so he drank the whole thing in 1 setting.  Within 2 days he became acutely ill initially with diaphoresis, dizziness then onset of diarrhea with multiple episodes of watery stools.  This was associated with severe fatigue.  About 2 days later he went to urgent care and [place], was not given any specific treatment, did do a stool culture which he says he was told was negative.  He went on a brat diet, stayed out of work for about a week and gradually felt better.  He started going back to a regular diet and then symptoms recurred.  He was then seen by an urgent care via a virtual visit.  He was eventually seen by Dr. Marvis Repress on 03/28/2019 and at that time had labs done with normal WBC, hemoglobin 16, c-Met unremarkable and TSH within normal limits.  He was asked to avoid dairy, and continue bland diet.  He has also started Culturelle 1 daily and has been drinking, Kambucha, which he says tastes terrible but seems to make him feel better.  He is now into a week 6 of his illness.  He was able to go back to work last week.  He continues to feel somewhat bloated and uncomfortable in his abdomen, says his abdomen is very noisy.  He had been having 1-2 loose stools per day over the past couple of weeks, no bleeding.  Yesterday had no bowel movement and today had a formed stool.  He is very gradually been adding back a few foods but has been afraid to advance his diet.  His energy level is improving.  He has  never had any fever or chills that he has been aware of.  He feels that most of his weight loss was due to dietary restriction. Patient had not been on any recent antibiotics no recent travel etc. Family history negative for GI disease/IBD. He admits to being anxious over his illness as he is really never been sick and says he felt like "death" for  weeks.  Review of Systems Pertinent positive and negative review of systems were noted in the above HPI section.  All other review of systems was otherwise negative.  Outpatient Encounter Medications as of 04/16/2019  Medication Sig   AMBULATORY NON FORMULARY MEDICATION Kombucha drink I drink daily   EPINEPHrine 0.3 mg/0.3 mL IJ SOAJ injection as needed.   Lactobacillus-Inulin ([place] PO) Take 1 capsule by mouth daily.   hyoscyamine (LEVSIN SL) 0.125 MG SL tablet Place 1 tablet (0.125 mg total) under the tongue every 4 (four) hours as needed. Take every 4-6 hours as needed for abdominal cramping, discomfort and diarrhea   [DISCONTINUED] hyoscyamine (LEVSIN SL) 0.125 MG SL tablet Place 1 tablet (0.125 mg total) under the tongue every 4 (four) hours as needed. Take every 4-6 hours as needed for abdominal cramping, discomfort and diarrhea   No facility-administered encounter medications on file  as of 04/16/2019.    Allergies  Allergen Reactions   Bee Venom    Poison Oak Extract    Amoxicillin Rash   Penicillins Rash   Patient Active Problem List   Diagnosis Date Noted   Complete tear of right ACL 05/05/2017   Acute lateral meniscus tear of right knee 05/05/2017   Social History   Socioeconomic History   Marital status: Married    Spouse name: Not on file   Number of children: 0   Years of education: Not on file   Highest education level: Not on file  Occupational History   Occupation: Engineer, building services  Social Needs   Financial resource strain: Not on file   Food insecurity    Worry: Not on file     Inability: Not on file   Transportation needs    Medical: Not on file    Non-medical: Not on file  Tobacco Use   Smoking status: Never Smoker   Smokeless tobacco: Former Systems developer    Types: Snuff  Substance and Sexual Activity   Alcohol use: Yes    Comment: OCCASIONALLY   Drug use: No   Sexual activity: Not on file  Lifestyle   Physical activity    Days per week: Not on file    Minutes per session: Not on file   Stress: Not on file  Relationships   Social connections    Talks on phone: Not on file    Gets together: Not on file    Attends religious service: Not on file    Active member of club or organization: Not on file    Attends meetings of clubs or organizations: Not on file    Relationship status: Not on file   Intimate partner violence    Fear of current or ex partner: Not on file    Emotionally abused: Not on file    Physically abused: Not on file    Forced sexual activity: Not on file  Other Topics Concern   Not on file  Social History Narrative   Not on file    Mr. Wavra family history includes Heart disease in his paternal grandfather.      Objective:    Vitals:   04/16/19 1024  BP: 106/74  Pulse: 72  Temp: 98.4 F (36.9 C)    Physical Exam Well-developed well-nourished young white male in no acute distress.  Height, Weight, 167 BMI 24.7  HEENT; nontraumatic normocephalic, EOMI, PER R LA, sclera anicteric. Oropharynx; not examined/wearing mass/COVID Neck; supple, no JVD Cardiovascular; regular rate and rhythm with S1-S2, no murmur rub or gallop Pulmonary; Clear bilaterally Abdomen; soft, nontender, nondistended, no palpable mass or hepatosplenomegaly, bowel sounds are active Rectal; not done today Skin; benign exam, no jaundice rash or appreciable lesions Extremities; no clubbing cyanosis or edema skin warm and dry Neuro/Psych; alert and oriented x4, grossly nonfocal mood and affect appropriate       Assessment & Plan:   #23   28 year old white male with acute illness onset within 48 hours of drinking a gallon of several week old milk.  Patient developed diaphoresis, dizziness and multiple episodes of diarrhea which persisted.  He has had associated extreme fatigue and over the past 6 weeks is lost about 26 pounds. By history suspect acute foodborne illness/bacterial gastroenteritis.  Symptoms have been somewhat prolonged-question postinfectious IBS. Rule out giardiasis Rule out new onset IBD though less likely.  #2 weight loss in part secondary to severe dietary restriction #3 ADD  Plan; CBC with differential, sed rate, CRP, GI path panel, stool for C. difficile, fecal lactoferrin. Patient encouraged to gradually advance his diet to a regular bland diet and gradually add back dairy guarding with yogurt cheese etc. Continue to push fluids. Trial of Levsin sublingual every 4 to 6 hours as needed abdominal discomfort, cramping, diarrhea. Await results of labs and stool studies.  If all above negative and symptoms are persisting we will need to consider colonoscopy with Dr. Fuller Plan.  However I have encouraged that he has been feeling better over the past couple of days and actually had a normal bowel movement today.   Ollen Rao S Monalisa Bayless PA-C 04/16/2019   Cc: Caren Macadam, MD

## 2019-04-16 NOTE — Patient Instructions (Addendum)
If you are age 29 or older, your body mass index should be between 23-30. Your Body mass index is 24.7 kg/m. If this is out of the aforementioned range listed, please consider follow up with your Primary Care Provider.  If you are age 78 or younger, your body mass index should be between 19-25. Your Body mass index is 24.7 kg/m. If this is out of the aformentioned range listed, please consider follow up with your Primary Care Provider.   Your provider has requested that you go to the basement level for lab work before leaving today. Press "B" on the elevator. The lab is located at the first door on the left as you exit the elevator.  We have sent the following medications to your pharmacy for you to pick up at your convenience: [place] II daily for one more month.  Gradually advance diet. Avoid very fatty foods, heavy sauces.  Gradually re-introduce dairy.  Start with yogurt/cheese.  Thank you for choosing me and [facility].   Amy Esterwood, PA-C

## 2019-04-16 NOTE — Progress Notes (Signed)
Reviewed and agree with management plan.  Malcolm T. Stark, MD FACG [facility]  

## 2019-08-02 ENCOUNTER — Other Ambulatory Visit: Payer: Self-pay | Admitting: Physician Assistant

## 2019-08-02 DIAGNOSIS — F410 Panic disorder [episodic paroxysmal anxiety] without agoraphobia: Secondary | ICD-10-CM | POA: Diagnosis not present

## 2019-08-02 DIAGNOSIS — F321 Major depressive disorder, single episode, moderate: Secondary | ICD-10-CM | POA: Diagnosis not present

## 2019-08-02 DIAGNOSIS — F419 Anxiety disorder, unspecified: Secondary | ICD-10-CM | POA: Diagnosis not present

## 2019-08-23 DIAGNOSIS — J029 Acute pharyngitis, unspecified: Secondary | ICD-10-CM | POA: Diagnosis not present

## 2019-08-23 DIAGNOSIS — F324 Major depressive disorder, single episode, in partial remission: Secondary | ICD-10-CM | POA: Diagnosis not present

## 2019-08-23 DIAGNOSIS — F411 Generalized anxiety disorder: Secondary | ICD-10-CM | POA: Diagnosis not present

## 2019-09-02 MED FILL — ESCITALOPRAM 10 MG TABLET: 10 | 90 days supply | Qty: 90 | Fill #0

## 2019-09-09 DIAGNOSIS — F324 Major depressive disorder, single episode, in partial remission: Secondary | ICD-10-CM | POA: Diagnosis not present

## 2019-09-09 DIAGNOSIS — F411 Generalized anxiety disorder: Secondary | ICD-10-CM | POA: Diagnosis not present

## 2019-10-21 MED FILL — ESCITALOPRAM 10 MG TABLET: 10 | 90 days supply | Qty: 135 | Fill #0

## 2019-11-22 DIAGNOSIS — F411 Generalized anxiety disorder: Secondary | ICD-10-CM | POA: Diagnosis not present

## 2019-11-22 DIAGNOSIS — F324 Major depressive disorder, single episode, in partial remission: Secondary | ICD-10-CM | POA: Diagnosis not present

## 2020-01-14 MED FILL — ESCITALOPRAM 10 MG TABLET: 10 | 90 days supply | Qty: 135 | Fill #1

## 2020-04-27 DIAGNOSIS — L910 Hypertrophic scar: Secondary | ICD-10-CM | POA: Diagnosis not present

## 2020-04-27 DIAGNOSIS — C44311 Basal cell carcinoma of skin of nose: Secondary | ICD-10-CM | POA: Diagnosis not present

## 2020-04-27 DIAGNOSIS — D485 Neoplasm of uncertain behavior of skin: Secondary | ICD-10-CM | POA: Diagnosis not present

## 2020-04-29 MED FILL — ESCITALOPRAM 10 MG TABLET: 10 | 90 days supply | Qty: 135 | Fill #0

## 2020-07-16 ENCOUNTER — Other Ambulatory Visit (HOSPITAL_COMMUNITY): Payer: Self-pay | Admitting: Family Medicine

## 2020-07-17 MED FILL — ESCITALOPRAM 10 MG TABLET: 10 | 90 days supply | Qty: 135 | Fill #0

## 2020-07-23 DIAGNOSIS — C44311 Basal cell carcinoma of skin of nose: Secondary | ICD-10-CM | POA: Diagnosis not present

## 2020-08-10 MED FILL — ESCITALOPRAM 10 MG TABLET: 10 | 90 days supply | Qty: 135 | Fill #0

## 2020-12-10 DIAGNOSIS — F324 Major depressive disorder, single episode, in partial remission: Secondary | ICD-10-CM | POA: Diagnosis not present

## 2020-12-10 DIAGNOSIS — F411 Generalized anxiety disorder: Secondary | ICD-10-CM | POA: Diagnosis not present

## 2021-05-27 DIAGNOSIS — F339 Major depressive disorder, recurrent, unspecified: Secondary | ICD-10-CM | POA: Diagnosis not present

## 2021-05-27 DIAGNOSIS — F411 Generalized anxiety disorder: Secondary | ICD-10-CM | POA: Diagnosis not present

## 2021-05-27 DIAGNOSIS — F410 Panic disorder [episodic paroxysmal anxiety] without agoraphobia: Secondary | ICD-10-CM | POA: Diagnosis not present

## 2021-07-07 DIAGNOSIS — F339 Major depressive disorder, recurrent, unspecified: Secondary | ICD-10-CM | POA: Diagnosis not present

## 2021-07-07 DIAGNOSIS — F411 Generalized anxiety disorder: Secondary | ICD-10-CM | POA: Diagnosis not present

## 2021-07-07 DIAGNOSIS — F410 Panic disorder [episodic paroxysmal anxiety] without agoraphobia: Secondary | ICD-10-CM | POA: Diagnosis not present

## 2021-08-19 DIAGNOSIS — F411 Generalized anxiety disorder: Secondary | ICD-10-CM | POA: Diagnosis not present

## 2021-08-19 DIAGNOSIS — F410 Panic disorder [episodic paroxysmal anxiety] without agoraphobia: Secondary | ICD-10-CM | POA: Diagnosis not present

## 2021-10-08 DIAGNOSIS — R051 Acute cough: Secondary | ICD-10-CM | POA: Diagnosis not present

## 2022-02-08 DIAGNOSIS — J039 Acute tonsillitis, unspecified: Secondary | ICD-10-CM | POA: Diagnosis not present

## 2023-02-01 DIAGNOSIS — R339 Retention of urine, unspecified: Secondary | ICD-10-CM | POA: Diagnosis not present

## 2023-02-01 DIAGNOSIS — R319 Hematuria, unspecified: Secondary | ICD-10-CM | POA: Diagnosis not present

## 2023-02-01 DIAGNOSIS — N410 Acute prostatitis: Secondary | ICD-10-CM | POA: Diagnosis not present

## 2023-02-14 DIAGNOSIS — R319 Hematuria, unspecified: Secondary | ICD-10-CM | POA: Diagnosis not present

## 2023-02-14 DIAGNOSIS — M549 Dorsalgia, unspecified: Secondary | ICD-10-CM | POA: Diagnosis not present

## 2023-06-21 DIAGNOSIS — R002 Palpitations: Secondary | ICD-10-CM | POA: Diagnosis not present

## 2023-06-21 DIAGNOSIS — R000 Tachycardia, unspecified: Secondary | ICD-10-CM | POA: Diagnosis not present

## 2023-09-26 DIAGNOSIS — K644 Residual hemorrhoidal skin tags: Secondary | ICD-10-CM | POA: Diagnosis not present

## 2023-09-26 DIAGNOSIS — L290 Pruritus ani: Secondary | ICD-10-CM | POA: Diagnosis not present

## 2023-10-11 DIAGNOSIS — C4441 Basal cell carcinoma of skin of scalp and neck: Secondary | ICD-10-CM | POA: Diagnosis not present

## 2023-10-11 DIAGNOSIS — D485 Neoplasm of uncertain behavior of skin: Secondary | ICD-10-CM | POA: Diagnosis not present

## 2023-10-16 DIAGNOSIS — L814 Other melanin hyperpigmentation: Secondary | ICD-10-CM | POA: Diagnosis not present

## 2023-10-16 DIAGNOSIS — L821 Other seborrheic keratosis: Secondary | ICD-10-CM | POA: Diagnosis not present

## 2023-10-16 DIAGNOSIS — D225 Melanocytic nevi of trunk: Secondary | ICD-10-CM | POA: Diagnosis not present

## 2023-10-16 DIAGNOSIS — D485 Neoplasm of uncertain behavior of skin: Secondary | ICD-10-CM | POA: Diagnosis not present

## 2023-10-16 DIAGNOSIS — Z85828 Personal history of other malignant neoplasm of skin: Secondary | ICD-10-CM | POA: Diagnosis not present

## 2023-11-01 DIAGNOSIS — C4441 Basal cell carcinoma of skin of scalp and neck: Secondary | ICD-10-CM | POA: Diagnosis not present

## 2023-11-20 DIAGNOSIS — F411 Generalized anxiety disorder: Secondary | ICD-10-CM | POA: Diagnosis not present

## 2023-11-20 DIAGNOSIS — F909 Attention-deficit hyperactivity disorder, unspecified type: Secondary | ICD-10-CM | POA: Diagnosis not present

## 2023-11-20 DIAGNOSIS — Z0000 Encounter for general adult medical examination without abnormal findings: Secondary | ICD-10-CM | POA: Diagnosis not present

## 2023-11-24 DIAGNOSIS — Z3009 Encounter for other general counseling and advice on contraception: Secondary | ICD-10-CM | POA: Diagnosis not present

## 2024-01-01 DIAGNOSIS — F909 Attention-deficit hyperactivity disorder, unspecified type: Secondary | ICD-10-CM | POA: Diagnosis not present

## 2024-01-01 DIAGNOSIS — F411 Generalized anxiety disorder: Secondary | ICD-10-CM | POA: Diagnosis not present

## 2024-01-01 DIAGNOSIS — F324 Major depressive disorder, single episode, in partial remission: Secondary | ICD-10-CM | POA: Diagnosis not present

## 2024-01-10 DIAGNOSIS — Z302 Encounter for sterilization: Secondary | ICD-10-CM | POA: Diagnosis not present

## 2024-05-21 DIAGNOSIS — F909 Attention-deficit hyperactivity disorder, unspecified type: Secondary | ICD-10-CM | POA: Diagnosis not present

## 2024-05-21 DIAGNOSIS — F411 Generalized anxiety disorder: Secondary | ICD-10-CM | POA: Diagnosis not present
